# Patient Record
Sex: Male | Born: 1985 | Hispanic: Yes | Marital: Single | Smoking: Former smoker
Health system: Southern US, Community
[De-identification: ages and names within clinical notes are randomized; demographics above are authoritative.]

## PROBLEM LIST (undated history)

## (undated) DIAGNOSIS — I10 Essential (primary) hypertension: Secondary | ICD-10-CM

## (undated) HISTORY — DX: Essential (primary) hypertension: I10

## (undated) HISTORY — PX: HAIR TRANSPLANT: SHX1719

---
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
## 2006-01-26 ENCOUNTER — Emergency Department: Payer: Self-pay | Admitting: Internal Medicine

## 2011-12-29 ENCOUNTER — Emergency Department: Payer: Self-pay | Admitting: Emergency Medicine

## 2017-04-28 ENCOUNTER — Emergency Department
Admission: EM | Admit: 2017-04-28 | Discharge: 2017-04-28 | Disposition: A | Discharge status: Home / Self Care | Payer: Self-pay | Attending: Emergency Medicine | Admitting: Emergency Medicine

## 2017-04-28 ENCOUNTER — Other Ambulatory Visit: Payer: Self-pay

## 2017-04-28 ENCOUNTER — Emergency Department: Payer: Self-pay

## 2017-04-28 DIAGNOSIS — R079 Chest pain, unspecified: Secondary | ICD-10-CM | POA: Insufficient documentation

## 2017-04-28 LAB — TROPONIN I
Troponin I: <0.03 ng/mL (ref <0.03)
Troponin I: <0.03 ng/mL (ref <0.03)

## 2017-04-28 LAB — CBC
HCT: 49.8 % (ref 40.0–52.0)
Hemoglobin: 17.1 g/dL (ref 13.0–18.0)
MCH: 30.9 pg (ref 26.0–34.0)
MCHC: 34.4 g/dL (ref 32.0–36.0)
MCV: 89.8 fL (ref 80.0–100.0)
PLATELETS: 189 10*3/uL (ref 150–440)
RBC: 5.54 MIL/uL (ref 4.40–5.90)
RDW: 12.5 % (ref 11.5–14.5)
WBC: 8.7 10*3/uL (ref 3.8–10.6)

## 2017-04-28 LAB — BASIC METABOLIC PANEL
ANION GAP: 8 (ref 5–15)
BUN: 19 mg/dL (ref 6–20)
CHLORIDE: 108 mmol/L (ref 101–111)
CO2: 23 mmol/L (ref 22–32)
CREATININE: 0.84 mg/dL (ref 0.61–1.24)
Calcium: 9.1 mg/dL (ref 8.9–10.3)
GFR calc non Af Amer: >60 mL/min (ref >60)
Glucose, Bld: 101 mg/dL — ABNORMAL HIGH (ref 65–99)
POTASSIUM: 4 mmol/L (ref 3.5–5.1)
SODIUM: 139 mmol/L (ref 135–145)

## 2017-04-28 LAB — FIBRIN DERIVATIVES D-DIMER (ARMC ONLY): Fibrin derivatives D-dimer (ARMC): 102.52 ng/mL (FEU) (ref 0.00–499.00)

## 2017-04-28 MED ORDER — ASPIRIN 81 MG PO CHEW
324.0000 mg | CHEWABLE_TABLET | Freq: Once | ORAL | Status: AC
  Administered 2017-04-28: 324 mg via ORAL

## 2017-04-28 MED ORDER — ASPIRIN 81 MG PO CHEW
CHEWABLE_TABLET | ORAL | Status: AC
  Filled 2017-04-28: qty 4

## 2017-04-28 MED ORDER — GI COCKTAIL ~~LOC~~
30.0000 mL | Freq: Once | ORAL | Status: AC
  Administered 2017-04-28: 30 mL via ORAL
  Filled 2017-04-28: qty 30

## 2017-04-28 NOTE — ED Triage Notes (Signed)
Patient reports left sided chest pain off/on for several days.  Tonight woke him from sleep.

## 2017-04-28 NOTE — ED Provider Notes (Signed)
[facility] Emergency Department Provider Note _________   First MD Initiated Contact with Patient 04/28/17 0423     (approximate)  I have reviewed the triage vital signs and the nursing notes.   HISTORY  Chief Complaint Chest Pain    HPI Johnathan Burnett is a 32 y.o. male presents to the emergency department with history of intermittent nonradiating sharp left chest pain times "several days.  Patient states that he awoke tonight from sleep with left-sided chest pain as well.  Patient states that the pain is worse with deep inspiration.  Patient denies any cough no fever.  Patient denies any palpitations.  Patient denies any dizziness.  Patient denies any lower extremity pain or swelling.  Patient denies any family or personal history of DVT PE or CAD.  Patient states current pain score 7 out of 10.   Past medical history None There are no active problems to display for this patient.    Prior to Admission medications   Not on File    Allergies No known drug allergies   Family history No family history of CAD or DVT/PE  Social History Social History   Tobacco Use  . Smoking status: Not on file  Substance Use Topics  . Alcohol use: Not on file  . Drug use: Not on file    Review of Systems Constitutional: No fever/chills Eyes: No visual changes. ENT: No sore throat. Cardiovascular: Denies chest pain. Respiratory: Denies shortness of breath. Gastrointestinal: No abdominal pain.  No nausea, no vomiting.  No diarrhea.  No constipation. Genitourinary: Negative for dysuria. Musculoskeletal: Negative for neck pain.  Negative for back pain. Integumentary: Negative for rash. Neurological: Negative for headaches, focal weakness or numbness.  ____________________________________________   PHYSICAL EXAM:  VITAL SIGNS: ED Triage Vitals  Enc Vitals Group     BP 04/28/17 0123 (!) 137/98     Pulse Rate 04/28/17 0123 72     Resp 04/28/17  0123 (!) 74     Temp 04/28/17 0123 98 F (36.7 C)     Temp Source 04/28/17 0123 Oral     SpO2 04/28/17 0123 98 %     Weight 04/28/17 0124 68 kg (150 lb)     Height 04/28/17 0124 1.651 m (5\' 5" )     Head Circumference --      Peak Flow --      Pain Score 04/28/17 0123 10     Pain Loc --      Pain Edu? --      Excl. in GC? --     Constitutional: Alert and oriented. Well appearing and in no acute distress. Eyes: Conjunctivae are normal. Head: Atraumatic. Mouth/Throat: Mucous membranes are moist. Oropharynx non-erythematous. Neck: No stridor.   Cardiovascular: Normal rate, regular rhythm. Good peripheral circulation. Grossly normal heart sounds. Respiratory: Normal respiratory effort.  No retractions. Lungs CTAB. Gastrointestinal: Soft and nontender. No distention.  Musculoskeletal: No lower extremity tenderness nor edema. No gross deformities of extremities. Neurologic:  Normal speech and language. No gross focal neurologic deficits are appreciated.  Skin:  Skin is warm, dry and intact. No rash noted.  ____________________________________________   LABS (all labs ordered are listed, but only abnormal results are displayed)  Labs Reviewed  BASIC METABOLIC PANEL - Abnormal; Notable for the following components:      Result Value   Glucose, Bld 101 (*)    All other components within normal limits  CBC  TROPONIN I  TROPONIN I  FIBRIN DERIVATIVES D-DIMER ([facility] ONLY)   ____________________________________________  EKG  ED ECG REPORT I, [place] N Laquenta Whitsell, the attending physician, personally viewed and interpreted this ECG.   Date: 04/28/2017  EKG Time: 1:20 AM  Rate: 60  Rhythm: Normal sinus rhythm  Axis: Normal  Intervals: Normal  ST&T Change: None  ____________________________________________  RADIOLOGY I, [place] N Rosita Guzzetta, personally viewed and evaluated these images (plain radiographs) as part of my medical decision making, as well as reviewing the written report  by the radiologist.  ED MD interpretation: No acute cardiopulmonary disease  Official radiology report(s): Dg Chest 2 View  Result Date: 04/28/2017 CLINICAL DATA:  32 year old male with left chest pain intermittently for several days. Pain woke him from sleep tonight. EXAM: CHEST - 2 VIEW COMPARISON:  None. FINDINGS: Normal lung volumes. Normal cardiac size and mediastinal contours. Visualized tracheal air column is within normal limits. The lungs are clear. No pneumothorax or pleural effusion. No osseous abnormality identified. Negative visible bowel gas pattern. IMPRESSION: Negative.  No acute cardiopulmonary abnormality. Electronically Signed   By: Odessa Fleming M.D.   On: 04/28/2017 02:08      Procedures   ____________________________________________   INITIAL IMPRESSION / ASSESSMENT AND PLAN / ED COURSE  As part of my medical decision making, I reviewed the following data within the electronic MEDICAL RECORD NUMBER   32 year old male presenting with above-stated history of chest pain very consider the possibility of CAD EKG revealed no evidence of ischemia or infarction.  Troponin negative x2.  Also consider the possibility of pulmonary emboli however d-dimer is negative. ____________________________________________  FINAL CLINICAL IMPRESSION(S) / ED DIAGNOSES  Final diagnoses:  Chest pain, unspecified type     MEDICATIONS GIVEN DURING THIS VISIT:  Medications  gi cocktail (Maalox,Lidocaine,Donnatal) (has no administration in time range)  aspirin chewable tablet 324 mg (324 mg Oral Given 04/28/17 0438)     ED Discharge Orders    None       Note:  This document was prepared using Dragon voice recognition software and may include unintentional dictation errors.    Darci Current, MD 04/28/17 [phone]

## 2017-04-30 ENCOUNTER — Encounter: Payer: Self-pay | Admitting: *Deleted

## 2017-04-30 ENCOUNTER — Ambulatory Visit
Admission: RE | Admit: 2017-04-30 | Discharge: 2017-04-30 | Disposition: A | Discharge status: Home / Self Care | Payer: 59 | Source: Ambulatory Visit | Attending: Internal Medicine | Admitting: Internal Medicine

## 2017-04-30 ENCOUNTER — Encounter
Admission: RE | Disposition: A | Discharge status: Home / Self Care | Payer: Self-pay | Source: Ambulatory Visit | Attending: Internal Medicine

## 2017-04-30 DIAGNOSIS — I200 Unstable angina: Secondary | ICD-10-CM | POA: Insufficient documentation

## 2017-04-30 DIAGNOSIS — I10 Essential (primary) hypertension: Secondary | ICD-10-CM | POA: Diagnosis not present

## 2017-04-30 DIAGNOSIS — R079 Chest pain, unspecified: Secondary | ICD-10-CM

## 2017-04-30 HISTORY — PX: LEFT HEART CATH AND CORONARY ANGIOGRAPHY: CATH118249

## 2017-04-30 LAB — CARDIAC CATHETERIZATION: Cath EF Quantitative: 60 %

## 2017-04-30 SURGERY — LEFT HEART CATH AND CORONARY ANGIOGRAPHY
Anesthesia: Moderate Sedation

## 2017-04-30 MED ORDER — VERAPAMIL HCL 2.5 MG/ML IV SOLN
INTRAVENOUS | Status: AC
  Filled 2017-04-30: qty 2

## 2017-04-30 MED ORDER — SODIUM CHLORIDE 0.9 % WEIGHT BASED INFUSION: 1.0000 mL/kg/h | INTRAVENOUS | Status: DC

## 2017-04-30 MED ORDER — METHYLPREDNISOLONE SODIUM SUCC 125 MG IJ SOLR
INTRAMUSCULAR | Status: AC
  Filled 2017-04-30: qty 2

## 2017-04-30 MED ORDER — ASPIRIN 81 MG PO CHEW: 81.0000 mg | CHEWABLE_TABLET | ORAL | Status: DC

## 2017-04-30 MED ORDER — SODIUM CHLORIDE 0.9% FLUSH: 3.0000 mL | Freq: Two times a day (BID) | INTRAVENOUS | Status: DC

## 2017-04-30 MED ORDER — SODIUM CHLORIDE 0.9 % WEIGHT BASED INFUSION
3.0000 mL/kg/h | INTRAVENOUS | Status: AC
  Administered 2017-04-30: 3 mL/kg/h via INTRAVENOUS

## 2017-04-30 MED ORDER — IOPAMIDOL (ISOVUE-300) INJECTION 61%
INTRAVENOUS | Status: DC | PRN
  Administered 2017-04-30: 70 mL via INTRA_ARTERIAL

## 2017-04-30 MED ORDER — DIPHENHYDRAMINE HCL 50 MG/ML IJ SOLN
INTRAMUSCULAR | Status: AC
  Filled 2017-04-30: qty 1

## 2017-04-30 MED ORDER — VERAPAMIL HCL 2.5 MG/ML IV SOLN
INTRAVENOUS | Status: DC | PRN
  Administered 2017-04-30: 2.5 mg via INTRA_ARTERIAL

## 2017-04-30 MED ORDER — ACETAMINOPHEN 325 MG PO TABS: 650.0000 mg | ORAL_TABLET | ORAL | Status: DC | PRN

## 2017-04-30 MED ORDER — METHYLPREDNISOLONE SODIUM SUCC 125 MG IJ SOLR
INTRAMUSCULAR | Status: DC | PRN
  Administered 2017-04-30: 125 mg via INTRAVENOUS

## 2017-04-30 MED ORDER — SODIUM CHLORIDE 0.9 % IV SOLN: INTRAVENOUS | Status: DC

## 2017-04-30 MED ORDER — DIPHENHYDRAMINE HCL 50 MG/ML IJ SOLN
INTRAMUSCULAR | Status: DC | PRN
  Administered 2017-04-30: 50 mg via INTRAVENOUS

## 2017-04-30 MED ORDER — SODIUM CHLORIDE 0.9 % IV SOLN: 250.0000 mL | INTRAVENOUS | Status: DC | PRN

## 2017-04-30 MED ORDER — MIDAZOLAM HCL 2 MG/2ML IJ SOLN
INTRAMUSCULAR | Status: DC | PRN
  Administered 2017-04-30: 1 mg via INTRAVENOUS

## 2017-04-30 MED ORDER — SODIUM CHLORIDE 0.9% FLUSH: 3.0000 mL | INTRAVENOUS | Status: DC | PRN

## 2017-04-30 MED ORDER — HEPARIN (PORCINE) IN NACL 2-0.9 UNIT/ML-% IJ SOLN
INTRAMUSCULAR | Status: AC
  Filled 2017-04-30: qty 1000

## 2017-04-30 MED ORDER — FENTANYL CITRATE (PF) 100 MCG/2ML IJ SOLN
INTRAMUSCULAR | Status: AC
  Filled 2017-04-30: qty 2

## 2017-04-30 MED ORDER — FAMOTIDINE 20 MG PO TABS
ORAL_TABLET | ORAL | Status: AC
  Administered 2017-04-30: 20 mg
  Filled 2017-04-30: qty 1

## 2017-04-30 MED ORDER — HEPARIN SODIUM (PORCINE) 1000 UNIT/ML IJ SOLN
INTRAMUSCULAR | Status: AC
  Filled 2017-04-30: qty 1

## 2017-04-30 MED ORDER — FENTANYL CITRATE (PF) 100 MCG/2ML IJ SOLN
INTRAMUSCULAR | Status: DC | PRN
  Administered 2017-04-30: 25 ug via INTRAVENOUS

## 2017-04-30 MED ORDER — MIDAZOLAM HCL 2 MG/2ML IJ SOLN
INTRAMUSCULAR | Status: AC
  Filled 2017-04-30: qty 2

## 2017-04-30 MED ORDER — ONDANSETRON HCL 4 MG/2ML IJ SOLN: 4.0000 mg | Freq: Four times a day (QID) | INTRAMUSCULAR | Status: DC | PRN

## 2017-04-30 SURGICAL SUPPLY — 7 items
CATH 5F 110X4 TIG (CATHETERS) ×3 IMPLANT
DEVICE RAD TR BAND REGULAR (VASCULAR PRODUCTS) ×3 IMPLANT
GLIDESHEATH SLEND A-KIT 6F 22G (SHEATH) ×3 IMPLANT
GLIDESHEATH SLEND SS 6F .021 (SHEATH) ×3 IMPLANT
KIT MANI 3VAL PERCEP (MISCELLANEOUS) ×3 IMPLANT
PACK CARDIAC CATH (CUSTOM PROCEDURE TRAY) ×3 IMPLANT
WIRE ROSEN-J .035X260CM (WIRE) ×3 IMPLANT

## 2017-05-01 ENCOUNTER — Encounter: Payer: Self-pay | Admitting: Internal Medicine

## 2017-05-05 ENCOUNTER — Encounter: Payer: Self-pay | Admitting: Internal Medicine

## 2017-09-04 LAB — HM HIV SCREENING LAB: HM HIV Screening: NEGATIVE

## 2019-01-04 ENCOUNTER — Telehealth: Payer: Self-pay | Admitting: General Practice

## 2019-01-04 NOTE — Telephone Encounter (Signed)
Patient called today in regard to making a new patient appt to get a physical done He stated that his dad is a patient of yours and recommended he see you.  Are you okay seeing this patient?

## 2019-01-04 NOTE — Telephone Encounter (Signed)
Ok to schedule in open 30 min slot.  Thank you.  

## 2019-01-05 ENCOUNTER — Other Ambulatory Visit: Payer: Self-pay

## 2019-01-05 ENCOUNTER — Encounter: Payer: Self-pay | Admitting: Family Medicine

## 2019-01-05 ENCOUNTER — Ambulatory Visit: Payer: Self-pay | Admitting: Family Medicine

## 2019-01-05 ENCOUNTER — Other Ambulatory Visit: Payer: Self-pay | Admitting: Family Medicine

## 2019-01-05 DIAGNOSIS — Z202 Contact with and (suspected) exposure to infections with a predominantly sexual mode of transmission: Secondary | ICD-10-CM

## 2019-01-05 DIAGNOSIS — Z113 Encounter for screening for infections with a predominantly sexual mode of transmission: Secondary | ICD-10-CM

## 2019-01-05 MED ORDER — AZITHROMYCIN 500 MG PO TABS
1000.0000 mg | ORAL_TABLET | Freq: Once | ORAL | Status: AC
  Administered 2019-01-05: 1000 mg via ORAL

## 2019-01-05 NOTE — Progress Notes (Signed)
Pt received treatment as a contact to Chlamydia per provider order and per standing order. Counseled pt per provider orders and pt states understanding. Provider orders completed.Ronny Bacon, RN

## 2019-01-05 NOTE — Progress Notes (Signed)
STI clinic/screening visit  Subjective:  Johnathan Burnett is a 33 y.o. male being seen today for an STI screening visit. The patient reports they do not have symptoms.  Patient reports that they do not desire a pregnancy in the next year.   They reported they are not interested in discussing contraception today.   Patient has the following medical conditions:  There are no active problems to display for this patient.   Chief Complaint  Patient presents with  . SEXUALLY TRANSMITTED DISEASE    HPI  Patient reports here for chlamydia treatment today, partner tested positive. Denies GU symptoms, though endorses itching on tops of shoulders x20mo and patch of skin change on abdomen x several years, worse in wintertime.  See flowsheet for further details and programmatic requirements.    The following portions of the patient's history were reviewed and updated as appropriate: allergies, current medications, past medical history, past social history, past surgical history and problem list.  Objective:  There were no vitals filed for this visit.  Physical Exam Constitutional:      Appearance: Normal appearance.  HENT:     Head: Normocephalic and atraumatic.     Comments: No nits or hair loss    Mouth/Throat:     Mouth: Mucous membranes are moist.     Pharynx: Oropharynx is clear. No oropharyngeal exudate or posterior oropharyngeal erythema.  Pulmonary:     Effort: Pulmonary effort is normal.  Abdominal:     General: Abdomen is flat.     Palpations: Abdomen is soft. There is no hepatomegaly or mass.     Tenderness: There is no abdominal tenderness.  Genitourinary:    Pubic Area: No rash or pubic lice.      Penis: Normal and circumcised.      Scrotum/Testes: Normal.     Epididymis:     Right: Normal.     Left: Normal.     Rectum: Normal.  Lymphadenopathy:     Head:     Right side of head: No preauricular or posterior auricular adenopathy.     Left side of head: No  preauricular or posterior auricular adenopathy.     Cervical: No cervical adenopathy.     Upper Body:     Right upper body: No supraclavicular or axillary adenopathy.     Left upper body: No supraclavicular or axillary adenopathy.     Lower Body: No right inguinal adenopathy. No left inguinal adenopathy.  Skin:    General: Skin is warm and dry.     Findings: No rash (no rash aside from patch of thickened skin on abdomen).       Neurological:     Mental Status: He is alert and oriented to person, place, and time.       Assessment and Plan:  Johnathan Burnett is a 33 y.o. male presenting to the [facility] for STI screening  1. Screening examination for venereal disease -Screenings today as below.  -Patient does not meet criteria for HepB HepC Screening.  -Discussed that GC/CT NAAT may take >1 week to result. Counseled on warning s/sx and when to seek care. Recommended condom use with all sex. 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 Handout of primary care providers also given to pt upon request.  - Gonococcus culture - throat - Gonococcus culture - urethra - HIV [facility] - Syphilis Serology, [facility]  2. Chlamydia contact -Treatment today as below. Pt to RTC if vomits < 2 hr after taking medicine. -Advised no sex for 7 days after both pt and partner completes treatment and encouraged condoms with all sex. - azithromycin (ZITHROMAX) tablet 1,000 mg    Return for screening as needed.  Future Appointments  Date Time Provider [place]  01/06/2019  3:00 PM Doreen Beam, FNP [facility]  01/08/2019  3:00 PM Ria Bush, MD [facility]    Kandee Keen, PA-C

## 2019-01-06 ENCOUNTER — Ambulatory Visit: Payer: Self-pay | Admitting: Adult Health

## 2019-01-08 ENCOUNTER — Ambulatory Visit: Payer: Self-pay | Admitting: Family Medicine

## 2019-01-12 LAB — GONOCOCCUS CULTURE

## 2019-02-03 ENCOUNTER — Other Ambulatory Visit: Payer: Self-pay

## 2019-02-03 DIAGNOSIS — Z20822 Contact with and (suspected) exposure to covid-19: Secondary | ICD-10-CM

## 2019-02-04 LAB — NOVEL CORONAVIRUS, NAA: SARS-CoV-2, NAA: DETECTED — AB

## 2019-02-08 ENCOUNTER — Telehealth: Payer: Self-pay | Admitting: Internal Medicine

## 2019-02-08 NOTE — Telephone Encounter (Signed)
Patient called about Positive Covid test from testing event. The patient has mild upper respiratory symptoms and occasional fever. Symptoms are mild at this time. Patient understands the needs to stay in isolation for a total of 10 days from onset of symptom or 14 days total from date of testing if no symptom. Patient knows the health department may be in touch. I answered all of patient's questions and all concerns addressed.  Jeanann Lewandowsky, MD

## 2019-07-02 IMAGING — CR DG CHEST 2V
2 series · 2 of 2 positions shown · non-contrast
Comparison: None.

CLINICAL DATA: 32-year-old male with left chest pain intermittently
for several days. Pain woke him from sleep tonight.

EXAM:
CHEST - 2 VIEW

[chest pa]
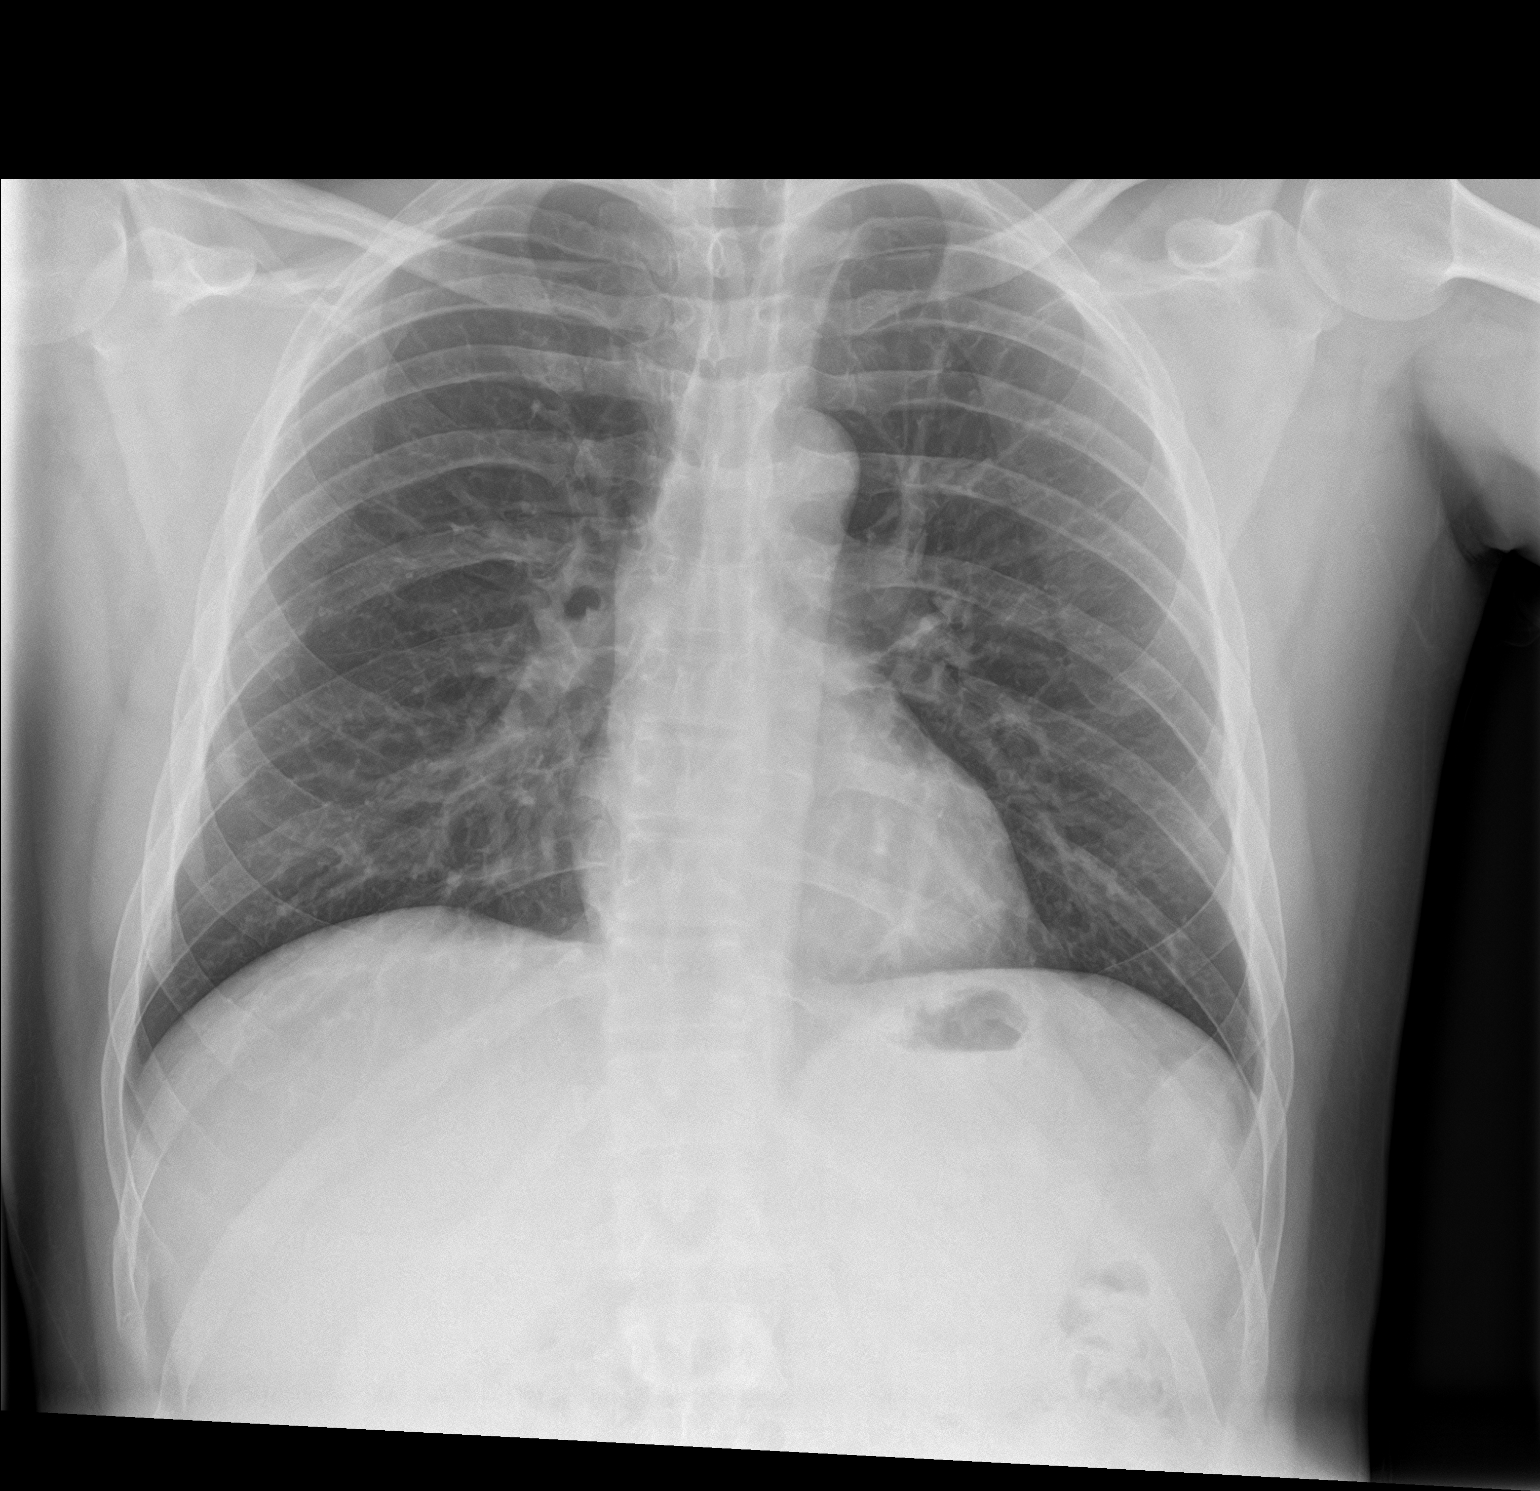

[chest lat]
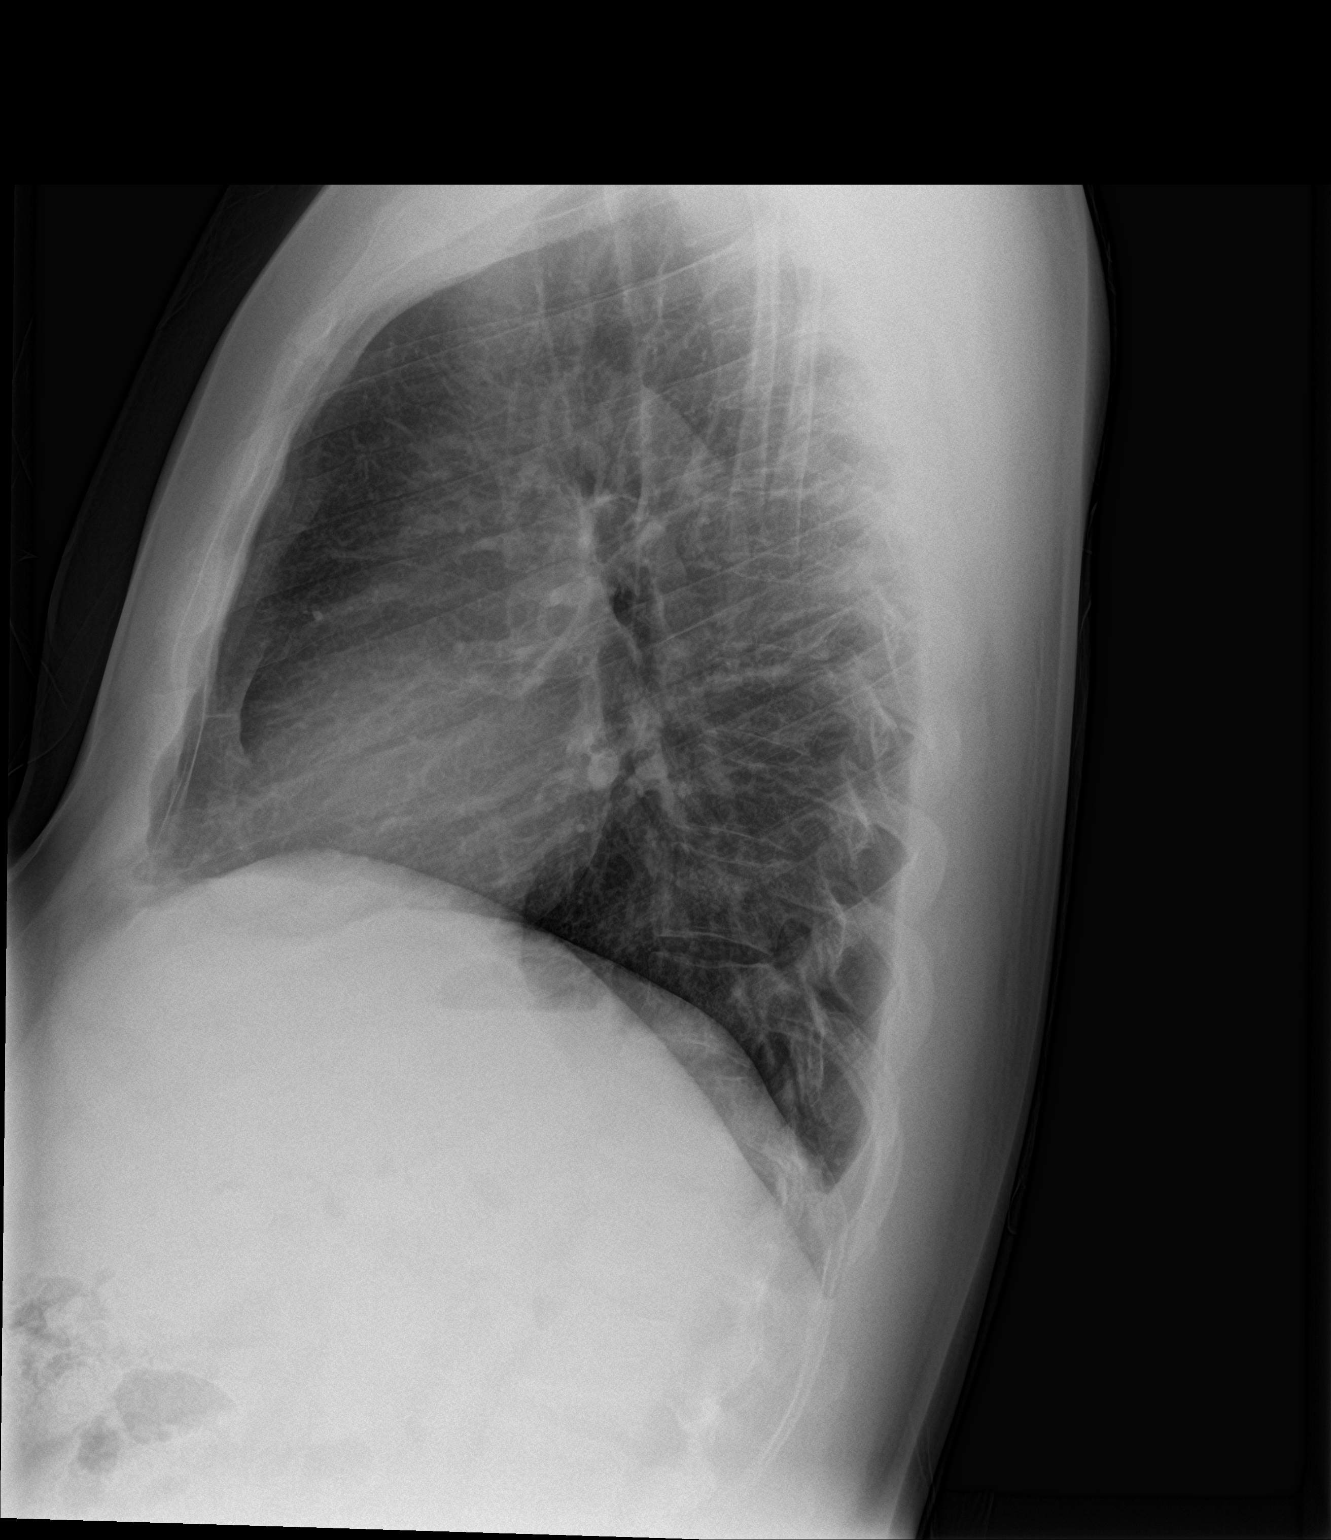

[2 of 2 positions shown; findings below may reference images not displayed]

FINDINGS: Normal lung volumes. Normal cardiac size and mediastinal contours.
Visualized tracheal air column is within normal limits. The lungs
are clear. No pneumothorax or pleural effusion. No osseous
abnormality identified. Negative visible bowel gas pattern.
IMPRESSION: Negative.  No acute cardiopulmonary abnormality.

## 2020-02-10 ENCOUNTER — Other Ambulatory Visit: Payer: Self-pay

## 2020-02-10 ENCOUNTER — Encounter: Payer: Self-pay | Admitting: Emergency Medicine

## 2020-02-10 ENCOUNTER — Ambulatory Visit
Admission: EM | Admit: 2020-02-10 | Discharge: 2020-02-10 | Disposition: A | Discharge status: Home / Self Care | Payer: Self-pay | Attending: Family Medicine | Admitting: Family Medicine

## 2020-02-10 ENCOUNTER — Ambulatory Visit: Admission: EM | Admit: 2020-02-10 | Discharge: 2020-02-10 | Discharge status: Against Medical Advice | Payer: Self-pay

## 2020-02-10 DIAGNOSIS — R21 Rash and other nonspecific skin eruption: Secondary | ICD-10-CM

## 2020-02-10 MED ORDER — HYDROXYZINE HCL 25 MG PO TABS: 25.0000 mg | ORAL_TABLET | Freq: Three times a day (TID) | ORAL | 0 refills | Status: DC | PRN

## 2020-02-10 MED ORDER — PREDNISONE 10 MG (21) PO TBPK: ORAL_TABLET | ORAL | 0 refills | Status: DC

## 2020-02-10 NOTE — ED Triage Notes (Signed)
Patient c/o itchy rash all over his body that started on Christmas day.

## 2020-02-10 NOTE — Discharge Instructions (Signed)
Medications as prescribed.  Continue OTC Claritin (Loratadine).   If persists, see Allergy. [facility] All[facility] [address] [place], [place]  [address] [phone] (phone)

## 2020-02-10 NOTE — ED Provider Notes (Signed)
[facility]    CSN: [id number] Arrival date & time: 02/10/20  1216      History   Chief Complaint Chief Complaint  Patient presents with  . Rash   HPI  35 year old male presents with rash and itching.  Patient reports that he developed a rash and associated itching on Christmas.  Resolved after taking levo cetirizine.  Recurred again yesterday.  Diffuse itching.  Mild rash.  No relieving factors.  He notes that he recently got a new pair of sheets.  That is the only thing that he is aware of that is new.  No other reported symptoms.  No other complaints.  Home Medications    Prior to Admission medications   Medication Sig Start Date End Date Taking? Authorizing Provider  hydrOXYzine (ATARAX/VISTARIL) 25 MG tablet Take 1 tablet (25 mg total) by mouth every 8 (eight) hours as needed for itching. 02/10/20  Yes Zaire Levesque G, DO  predniSONE (STERAPRED UNI-PAK 21 TAB) 10 MG (21) TBPK tablet 6 tablets on day 1; decrease by 1 tablet daily until gone. 02/10/20  Yes Tommie Sams, DO    Family History Family History  Problem Relation Age of Onset  . Diabetes Paternal Grandmother     Social History Social History   Tobacco Use  . Smoking status: Current Some Day Smoker    Types: Cigarettes  . Smokeless tobacco: Never Used  Vaping Use  . Vaping Use: Never used  Substance Use Topics  . Alcohol use: Yes    Alcohol/week: 8.0 standard drinks    Types: 8 Cans of beer per week  . Drug use: Never     Allergies   Patient has no known allergies.   Review of Systems Review of Systems  Constitutional: Negative.   Skin: Positive for rash.   Physical Exam Triage Vital Signs ED Triage Vitals  Enc Vitals Group     BP 02/10/20 1311 (!) 138/111     Pulse Rate 02/10/20 1311 85     Resp 02/10/20 1311 16     Temp 02/10/20 1311 98.4 F (36.9 C)     Temp Source 02/10/20 1311 Oral     SpO2 02/10/20 1311 97 %     Weight 02/10/20 1308 160 lb (72.6 kg)     Height 02/10/20  1308 5\' 4"  (1.626 m)     Head Circumference --      Peak Flow --      Pain Score 02/10/20 1308 0     Pain Loc --      Pain Edu? --      Excl. in GC? --    Updated Vital Signs BP (!) 138/111 (BP Location: Right Arm)   Pulse 85   Temp 98.4 F (36.9 C) (Oral)   Resp 16   Ht 5\' 4"  (1.626 m)   Wt 72.6 kg   SpO2 97%   BMI 27.46 kg/m   Visual Acuity Right Eye Distance:   Left Eye Distance:   Bilateral Distance:    Right Eye Near:   Left Eye Near:    Bilateral Near:     Physical Exam Vitals and nursing note reviewed.  Constitutional:      General: He is not in acute distress.    Appearance: Normal appearance. He is not ill-appearing.  HENT:     Head: Normocephalic and atraumatic.  Eyes:     General:        Right eye: No discharge.  Left eye: No discharge.     Conjunctiva/sclera: Conjunctivae normal.  Pulmonary:     Effort: Pulmonary effort is normal. No respiratory distress.  Skin:    Comments: Scattered areas of erythema. Dermatographia noted.   Neurological:     Mental Status: He is alert.  Psychiatric:        Mood and Affect: Mood normal.        Behavior: Behavior normal.    UC Treatments / Results  Labs (all labs ordered are listed, but only abnormal results are displayed) Labs Reviewed - No data to display  EKG   Radiology No results found.  Procedures Procedures (including critical care time)  Medications Ordered in UC Medications - No data to display  Initial Impression / Assessment and Plan / UC Course  I have reviewed the triage vital signs and the nursing notes.  Pertinent labs & imaging results that were available during my care of the patient were reviewed by me and considered in my medical decision making (see chart for details).    35 year old male presents with a rash and associated itching.  Patient has dermatographia on exam.  Placing on prednisone and Atarax.  Advised continued use of levocetirizine.  Information given  regarding Allergist if recurs or persists.   Final Clinical Impressions(s) / UC Diagnoses   Final diagnoses:  Rash     Discharge Instructions     Medications as prescribed.  Continue OTC Claritin (Loratadine).   If persists, see Allergy. [facility] [address] [place], [place]  [address] [phone] (phone)   ED Prescriptions    Medication Sig Dispense Auth. Provider   predniSONE (STERAPRED UNI-PAK 21 TAB) 10 MG (21) TBPK tablet 6 tablets on day 1; decrease by 1 tablet daily until gone. 21 tablet Jaylanie Boschee G, DO   hydrOXYzine (ATARAX/VISTARIL) 25 MG tablet Take 1 tablet (25 mg total) by mouth every 8 (eight) hours as needed for itching. 30 tablet Tommie Sams, DO     PDMP not reviewed this encounter.   Tommie Sams, [place] 02/10/20 1434

## 2020-03-06 ENCOUNTER — Encounter: Payer: Self-pay | Admitting: Emergency Medicine

## 2020-03-06 ENCOUNTER — Other Ambulatory Visit: Payer: Self-pay

## 2020-03-06 ENCOUNTER — Ambulatory Visit
Admission: EM | Admit: 2020-03-06 | Discharge: 2020-03-06 | Disposition: A | Discharge status: Home / Self Care | Payer: Self-pay

## 2020-03-06 DIAGNOSIS — R197 Diarrhea, unspecified: Secondary | ICD-10-CM

## 2020-03-06 DIAGNOSIS — R030 Elevated blood-pressure reading, without diagnosis of hypertension: Secondary | ICD-10-CM

## 2020-03-06 NOTE — ED Provider Notes (Signed)
[facility]    CSN: [id number] Arrival date & time: 03/06/20  1126      History   Chief Complaint Chief Complaint  Patient presents with  . Diarrhea    HPI Johnathan Burnett is a 35 y.o. male who presents with fever and body aches 3 days ago x 2 days. Has been having diarrhea x 3 days 5-8 times  A day. Only x 3 today and body aches are gone. Had negative Covid test 3 days ago and was  Denies vomiting. Has been nauseous. Has not been hungry. Denies blood in the stool. Yesterday ate at a restaurant and ate crab and chcickent. Denies being out the country or taking antibiotics in the past month.  He did have a coworker who had diarrhea last week.  He had broth this am before he came, and states he feels much better than the past 2 days, but wants something to stop the diarrhea  History reviewed. No pertinent past medical history.  There are no problems to display for this patient.   Past Surgical History:  Procedure Laterality Date  . LEFT HEART CATH AND CORONARY ANGIOGRAPHY N/A 04/30/2017   Procedure: LEFT HEART CATH AND CORONARY ANGIOGRAPHY;  Surgeon: Yolonda Kida, MD;  Location: [facility];  Service: Cardiovascular;  Laterality: N/A;     Home Medications    Prior to Admission medications   Not on File    Family History Family History  Problem Relation Age of Onset  . Diabetes Paternal Grandmother     Social History Social History   Tobacco Use  . Smoking status: Current Some Day Smoker    Types: Cigarettes  . Smokeless tobacco: Never Used  Vaping Use  . Vaping Use: Never used  Substance Use Topics  . Alcohol use: Yes    Alcohol/week: 8.0 standard drinks    Types: 8 Cans of beer per week  . Drug use: Never     Allergies   Patient has no known allergies.   Review of Systems Review of Systems  Constitutional: Positive for appetite change and diaphoresis. Negative for activity change, chills and fatigue.       +  subjective fever  HENT: Negative for congestion, postnasal drip, rhinorrhea and sore throat.   Eyes: Negative for discharge.  Respiratory: Negative for cough, chest tightness and shortness of breath.   Cardiovascular: Negative for chest pain.  Gastrointestinal: Positive for diarrhea. Negative for abdominal pain, constipation, nausea and vomiting.  Musculoskeletal: Negative for gait problem and myalgias.  Skin: Negative for rash.  Neurological: Negative for headaches.     Physical Exam Triage Vital Signs ED Triage Vitals  Enc Vitals Group     BP 03/06/20 1233 (!) 131/100     Pulse Rate 03/06/20 1233 78     Resp 03/06/20 1233 18     Temp 03/06/20 1233 98 F (36.7 C)     Temp Source 03/06/20 1233 Oral     SpO2 03/06/20 1233 99 %     Weight 03/06/20 1232 160 lb 0.9 oz (72.6 kg)     Height 03/06/20 1232 5' 4"  (1.626 m)     Head Circumference --      Peak Flow --      Pain Score 03/06/20 1231 0     Pain Loc --      Pain Edu? --      Excl. in [place]? --    No data found.  Updated Vital Signs  BP (!) 131/100 (BP Location: Right Arm)   Pulse 78   Temp 98 F (36.7 C) (Oral)   Resp 18   Ht 5' 4"  (1.626 m)   Wt 160 lb 0.9 oz (72.6 kg)   SpO2 99%   BMI 27.47 kg/m   Visual Acuity Right Eye Distance:   Left Eye Distance:   Bilateral Distance:    Right Eye Near:   Left Eye Near:    Bilateral Near:     Physical Exam Physical Exam Vitals signs and nursing note reviewed.  Constitutional:      General: he is not in acute distress.    Appearance: Normal appearance. he is not ill-appearing, toxic-appearing or diaphoretic.  HENT:     Head: Normocephalic.     Right Ear: Tympanic membrane, ear canal and external ear normal.     Left Ear: Tympanic membrane, ear canal and external ear normal.     Nose: Nose normal.     Mouth/Throat:     Mouth: Mucous membranes are moist.  Eyes:     General: No scleral icterus.       Right eye: No discharge.        Left eye: No discharge.      Conjunctiva/sclera: Conjunctivae normal.  Neck:     Musculoskeletal: Neck supple. No neck rigidity.  Cardiovascular:     Rate and Rhythm: Normal rate and regular rhythm.     Heart sounds: No murmur.  Pulmonary:     Effort: Pulmonary effort is normal.     Breath sounds: Normal breath sounds.  Abdominal:     General: Bowel sounds are normal. There is no distension.     Palpations: Abdomen is soft. There is no mass.     Tenderness: There is no abdominal tenderness. There is no guarding or rebound.     Hernia: No hernia is present.  Musculoskeletal: Normal range of motion.  Lymphadenopathy:     Cervical: No cervical adenopathy.  Skin:    General: Skin is warm and dry.     Coloration: Skin is not jaundiced.     Findings: No rash.  Neurological:     Mental Status: he is alert and oriented to person, place, and time.     Gait: Gait normal.  Psychiatric:        Mood and Affect: Mood normal.        Behavior: Behavior normal.        Thought Content: Thought content normal.        Judgment: Judgment normal.     UC Treatments / Results  Labs (all labs ordered are listed, but only abnormal results are displayed) Labs Reviewed - No data to display  EKG   Radiology No results found.  Procedures Procedures (including critical care time)  Medications Ordered in UC Medications - No data to display  Initial Impression / Assessment and Plan / UC Course  I have reviewed the triage vital signs and the nursing notes. Diarrhea which may be viral vs Covid. Since he does not have insurance he prefers doing a covid test at home. Will follow BRAT diet and advised to take Pepto which will allow the virus to continue sheding, where the imodium or other antidiarrhea Rx will retain the virus and prolong the illness.  He was also advised to do BP readings and if => 140/90 x 3 when he is well, needs to get established with a PCP  Final Clinical Impressions(s) / UC Diagnoses  Final diagnoses:   Diarrhea, unspecified type  Elevated blood pressure reading     Discharge Instructions     You may tape Pepto  and take it as indicated per the box If you get worse or see blood in the stool, you need to be seen.  You may retest yourself for Covid with the kit you have at home.   Do blood pressure checks and if your number is 140/90 or more for 3 different times you need blood pressure medication and make sure you get established with a family Dr.     ED Prescriptions    None     PDMP not reviewed this encounter.   Shelby Mattocks, PA-C 03/06/20 1334

## 2020-03-06 NOTE — Discharge Instructions (Addendum)
You may tape Pepto  and take it as indicated per the box If you get worse or see blood in the stool, you need to be seen.  You may retest yourself for Covid with the kit you have at home.   Do blood pressure checks and if your number is 140/90 or more for 3 different times you need blood pressure medication and make sure you get established with a family Dr.

## 2020-03-06 NOTE — ED Triage Notes (Signed)
Patient c/o fever, diarrhea and body aches that started Friday. He states the diarrhea has continued and he thinks he had a fever this morning but did not check this. He did test negative for COVID on Friday.

## 2022-02-22 NOTE — Progress Notes (Signed)
Sleep Medicine   Office Visit  Patient Name: Johnathan Burnett DOB: 1966/05/31 MRN [id number]    Chief Complaint: sleep evaluation  Brief History:  Niklaus presents for an initial consult for sleep evaluation and to establish care. Patient has a long history of snoring. This is the same in all positions.   Sleep quality is sometimes good and sometimes poor. This is noted most nights. The patient's bed partner reports  loud snoring and witnessed apnea at night. The patient relates the following symptoms: Loud snoring and gasping are also present. The patient goes to sleep at 11pm and wakes up at 7am.  Sleep quality is same when outside home environment.  Patient has noted restlessness of his legs at night that would disrupt his sleep.  The patient  relates no  unusual behavior during the night.  The patient relates no history of psychiatric problems. The Epworth Sleepiness Score is 3 out of 24 .  The patient relates  Cardiovascular risk factors include: elevated blood pressure.     ROS  General: (-) fever, (-) chills, (-) night sweat Nose and Sinuses: (-) nasal stuffiness or itchiness, (-) postnasal drip, (-) nosebleeds, (-) sinus trouble. Mouth and Throat: (-) sore throat, (-) hoarseness. Neck: (-) swollen glands, (-) enlarged thyroid, (-) neck pain. Respiratory: - cough, - shortness of breath, - wheezing. Neurologic: - numbness, - tingling. Psychiatric: - anxiety, - depression Sleep behavior: -sleep paralysis -hypnogogic hallucinations -dream enactment      -vivid dreams -cataplexy -night terrors -sleep walking   Current Medication: No outpatient encounter medications on file as of 02/25/2022.   No facility-administered encounter medications on file as of 02/25/2022.    Surgical History: Past Surgical History:  Procedure Laterality Date   LEFT HEART CATH AND CORONARY ANGIOGRAPHY N/A 04/30/2017   Procedure: LEFT HEART CATH AND CORONARY ANGIOGRAPHY;  Surgeon: Yolonda Kida,  MD;  Location: [facility];  Service: Cardiovascular;  Laterality: N/A;    Medical History: History reviewed. No pertinent past medical history.  Family History: Non contributory to the present illness  Social History: Social History   Socioeconomic History   Marital status: Single    Spouse name: Not on file   Number of children: Not on file   Years of education: Not on file   Highest education level: Not on file  Occupational History   Not on file  Tobacco Use   Smoking status: Some Days    Types: Cigarettes   Smokeless tobacco: Never  Vaping Use   Vaping Use: Never used  Substance and Sexual Activity   Alcohol use: Yes    Alcohol/week: 8.0 standard drinks of alcohol    Types: 8 Cans of beer per week   Drug use: Never   Sexual activity: Yes    Partners: Female    Birth control/protection: None  Other Topics Concern   Not on file  Social History Narrative   Not on file   Social Determinants of Health   Financial Resource Strain: Not on file  Food Insecurity: Not on file  Transportation Needs: Not on file  Physical Activity: Not on file  Stress: Not on file  Social Connections: Not on file  Intimate Partner Violence: Not on file    Vital Signs: Blood pressure (!) 144/101, pulse 81, resp. rate 14, height 5\' 3"  (1.6 m), weight 172 lb (78 kg), SpO2 98 %. Body mass index is 30.47 kg/m.   Examination: General Appearance: The patient is well-developed, well-nourished, and  in no distress. Neck Circumference: 40 cm Skin: Gross inspection of skin unremarkable. Head: normocephalic, no gross deformities. Eyes: no gross deformities noted. ENT: ears appear grossly normal Neurologic: Alert and oriented. No involuntary movements.    STOP BANG RISK ASSESSMENT S (snore) Have you been told that you snore?     YES   T (tired) Are you often tired, fatigued, or sleepy during the day?   NO  O (obstruction) Do you stop breathing, choke, or gasp during sleep?  YES   P (pressure) Do you have or are you being treated for high blood pressure? NO   B (BMI) Is your body index greater than 35 kg/m? NO   A (age) Are you 36 years old or older? NO   N (neck) Do you have a neck circumference greater than 16 inches?   YES   G (gender) Are you a male? YES   TOTAL STOP/BANG "YES" ANSWERS 4                                                               A STOP-Bang score of 2 or less is considered low risk, and a score of 5 or more is high risk for having either moderate or severe OSA. For people who score 3 or 4, doctors may need to perform further assessment to determine how likely they are to have OSA.         EPWORTH SLEEPINESS SCALE:  Scale:  (0)= no chance of dozing; (1)= slight chance of dozing; (2)= moderate chance of dozing; (3)= high chance of dozing  Chance  Situtation    Sitting and reading: 0    Watching TV: 1    Sitting Inactive in public: 0    As a passenger in car: 1      Lying down to rest: 0    Sitting and talking: 0    Sitting quielty after lunch: 1    In a car, stopped in traffic: 0   TOTAL SCORE:   3 out of 24    SLEEP STUDIES:  None   LABS: No results found for this or any previous visit (from the past 2160 hour(s)).  Radiology: No results found.  No results found.  No results found.    Assessment and Plan: Patient Active Problem List   Diagnosis Date Noted   OSA (obstructive sleep apnea) 02/25/2022   Snoring 02/25/2022   Obesity (BMI 30-39.9) 02/25/2022   Elevated blood pressure reading without diagnosis of hypertension 02/25/2022   Allergic rhinitis due to animal (cat) (dog) hair and dander 02/25/2022   Allergic rhinitis 02/25/2022   1. OSA (obstructive sleep apnea) PLAN OSA:   Patient evaluation suggests high risk of sleep disordered breathing due to witnessed apnea, gasping, snoring.  Patient has comorbid cardiovascular risk factors including: elevated blood pressure which could be  exacerbated by pathologic sleep-disordered breathing.  Suggest: PSG to assess  the patient's sleep disordered breathing. The patient was also counselled on weight loss to optimize sleep health.   2. Snoring Await psg. He reports nasal collapse and difficulty breathing through his left nostril. History of many traumas to the nose. Recommend ENT evaluation.   3. Obesity (BMI 30-39.9) Obesity Counseling: Had a lengthy discussion regarding patients BMI and weight issues.  Patient was instructed on portion control as well as increased activity. Also discussed caloric restrictions with trying to maintain intake less than 2000 Kcal. Discussions were made in accordance with the 5As of weight management. Simple actions such as not eating late and if able to, taking a walk is suggested.   4. Elevated blood pressure reading without diagnosis of hypertension We discussed today's reading at length. He is asymptomatic. He does not have a PCP. He will purchase a blood pressure monitor and start checking and will find a provider to follow up with.  Hypertension Counseling:   The following hypertensive lifestyle modification were recommended and discussed:  1. Limiting alcohol intake to less than 1 oz/day of ethanol:(24 oz of beer or 8 oz of wine or 2 oz of 100-proof whiskey). 2. Take baby ASA 81 mg daily. 3. Importance of regular aerobic exercise and losing weight. 4. Reduce dietary saturated fat and cholesterol intake for overall cardiovascular health. 5. Maintaining adequate dietary potassium, calcium, and magnesium intake. 6. Regular monitoring of the blood pressure. 7. Reduce sodium intake to less than 100 mmol/day (less than 2.3 gm of sodium or less than 6 gm of sodium choride)      General Counseling: I have discussed the findings of the evaluation and examination with Christy Sartorius.  I have also discussed any further diagnostic evaluation thatmay be needed or ordered today. Orel verbalizes understanding of  the findings of todays visit. We also reviewed his medications today and discussed drug interactions and side effects including but not limited excessive drowsiness and altered mental states. We also discussed that there is always a risk not just to him but also people around him. he has been encouraged to call the office with any questions or concerns that should arise related to todays visit.  No orders of the defined types were placed in this encounter.       I have personally obtained a history, evaluated the patient, evaluated pertinent data, formulated the assessment and plan and placed orders.   This patient was seen today by Tressie Ellis, PA-C in collaboration with Dr. Devona Konig.   Allyne Gee, MD [facility] Diplomate ABMS Pulmonary and Critical Care Medicine Sleep medicine

## 2022-02-25 ENCOUNTER — Ambulatory Visit (INDEPENDENT_AMBULATORY_CARE_PROVIDER_SITE_OTHER): Payer: BLUE CROSS/BLUE SHIELD | Admitting: Internal Medicine

## 2022-02-25 VITALS — BP 144/101 | HR 81 | Resp 14 | Ht 63.0 in | Wt 172.0 lb

## 2022-02-25 DIAGNOSIS — R030 Elevated blood-pressure reading, without diagnosis of hypertension: Secondary | ICD-10-CM

## 2022-02-25 DIAGNOSIS — G4733 Obstructive sleep apnea (adult) (pediatric): Secondary | ICD-10-CM

## 2022-02-25 DIAGNOSIS — R0683 Snoring: Secondary | ICD-10-CM | POA: Insufficient documentation

## 2022-02-25 DIAGNOSIS — E669 Obesity, unspecified: Secondary | ICD-10-CM | POA: Diagnosis not present

## 2022-02-25 DIAGNOSIS — J3081 Allergic rhinitis due to animal (cat) (dog) hair and dander: Secondary | ICD-10-CM | POA: Insufficient documentation

## 2022-02-25 DIAGNOSIS — J309 Allergic rhinitis, unspecified: Secondary | ICD-10-CM | POA: Insufficient documentation

## 2022-02-26 ENCOUNTER — Ambulatory Visit
Admission: EM | Admit: 2022-02-26 | Discharge: 2022-02-26 | Disposition: A | Discharge status: Home / Self Care | Payer: BLUE CROSS/BLUE SHIELD

## 2022-02-26 DIAGNOSIS — R030 Elevated blood-pressure reading, without diagnosis of hypertension: Secondary | ICD-10-CM

## 2022-02-26 NOTE — ED Notes (Signed)
Primary Care appointment established for patient with Johnathan Burnett at [facility]. Appointment made for Wednesday March 06, 2022.

## 2022-02-26 NOTE — Discharge Instructions (Addendum)
Your blood pressure is elevated today at 138/100.  Please have this rechecked by your primary care provider in 2-4 weeks.

## 2022-02-26 NOTE — ED Triage Notes (Addendum)
Patient to Urgent Care with complaints of elevated blood pressure readings x1 month.   Reports BP readings yesterday 140/99, this morning readings 122/91. Reports checking it several weeks ago while in [place] and it was also elevated.   Denies any symptoms.   Patient attempted to set up pcp appointment prior to being seen at Urgent Care.

## 2022-02-26 NOTE — ED Provider Notes (Signed)
[facility] Johnathan Burnett    CSN: [id number] Arrival date & time: 02/26/22  0998      History   Chief Complaint Chief Complaint  Patient presents with   Hypertension    HPI Johnathan Burnett is a 37 y.o. male.  Patient presents with concern for elevated blood pressure readings over the past month.  He has checked it at home and it was checked at pulmonary clinic yesterday.  Blood pressure was 144/101 at pulmonology yesterday.  His readings at home have been  122-140/91-99.  He denies weakness, numbness, dizziness, headache, chest pain, shortness of breath, edema, or other symptoms.  He does not have a PCP.  Per patient's chart, elevated blood pressure readings have been noted for the past 4+ years.  His medical history includes obstructive sleep apnea.  The history is provided by the patient and medical records.    History reviewed. No pertinent past medical history.  Patient Active Problem List   Diagnosis Date Noted   OSA (obstructive sleep apnea) 02/25/2022   Snoring 02/25/2022   Obesity (BMI 30-39.9) 02/25/2022   Elevated blood pressure reading without diagnosis of hypertension 02/25/2022   Allergic rhinitis due to animal (cat) (dog) hair and dander 02/25/2022   Allergic rhinitis 02/25/2022    Past Surgical History:  Procedure Laterality Date   LEFT HEART CATH AND CORONARY ANGIOGRAPHY N/A 04/30/2017   Procedure: LEFT HEART CATH AND CORONARY ANGIOGRAPHY;  Surgeon: Yolonda Kida, MD;  Location: [facility];  Service: Cardiovascular;  Laterality: N/A;       Home Medications    Prior to Admission medications   Not on File    Family History Family History  Problem Relation Age of Onset   Diabetes Paternal Grandmother     Social History Social History   Tobacco Use   Smoking status: Some Days    Types: Cigarettes   Smokeless tobacco: Never  Vaping Use   Vaping Use: Never used  Substance Use Topics   Alcohol use: Yes    Alcohol/week: 8.0  standard drinks of alcohol    Types: 8 Cans of beer per week   Drug use: Never     Allergies   Patient has no known allergies.   Review of Systems Review of Systems  Respiratory:  Negative for cough and shortness of breath.   Cardiovascular:  Negative for chest pain, palpitations and leg swelling.  Neurological:  Negative for dizziness, seizures, syncope, facial asymmetry, speech difficulty, weakness, light-headedness, numbness and headaches.  All other systems reviewed and are negative.    Physical Exam Triage Vital Signs ED Triage Vitals  Enc Vitals Group     BP --      Pulse Rate 02/26/22 1005 71     Resp 02/26/22 1005 18     Temp 02/26/22 1005 97.8 F (36.6 C)     Temp src --      SpO2 02/26/22 1005 97 %     Weight --      Height --      Head Circumference --      Peak Flow --      Pain Score 02/26/22 1000 0     Pain Loc --      Pain Edu? --      Excl. in [place]? --    No data found.  Updated Vital Signs BP (!) 138/100   Pulse 71   Temp 97.8 F (36.6 C)   Resp 18   Ht  5\' 3"  (1.6 m)   Wt 172 lb (78 kg)   SpO2 97%   BMI 30.47 kg/m   Visual Acuity Right Eye Distance:   Left Eye Distance:   Bilateral Distance:    Right Eye Near:   Left Eye Near:    Bilateral Near:     Physical Exam Vitals and nursing note reviewed.  Constitutional:      General: He is not in acute distress.    Appearance: Normal appearance. He is well-developed. He is not ill-appearing.  HENT:     Mouth/Throat:     Mouth: Mucous membranes are moist.  Cardiovascular:     Rate and Rhythm: Normal rate and regular rhythm.     Heart sounds: Normal heart sounds.  Pulmonary:     Effort: Pulmonary effort is normal. No respiratory distress.     Breath sounds: Normal breath sounds.  Musculoskeletal:     Cervical back: Neck supple.  Skin:    General: Skin is warm and dry.  Neurological:     General: No focal deficit present.     Mental Status: He is alert and oriented to person,  place, and time.     Sensory: No sensory deficit.     Motor: No weakness.     Gait: Gait normal.  Psychiatric:        Mood and Affect: Mood normal.      UC Treatments / Results  Labs (all labs ordered are listed, but only abnormal results are displayed) Labs Reviewed - No data to display  EKG   Radiology No results found.  Procedures Procedures (including critical care time)  Medications Ordered in UC Medications - No data to display  Initial Impression / Assessment and Plan / UC Course  I have reviewed the triage vital signs and the nursing notes.  Pertinent labs & imaging results that were available during my care of the patient were reviewed by me and considered in my medical decision making (see chart for details).   Elevated blood pressure reading.  Patient's blood pressure readings have been elevated in his chart since 2019.  He does not have a PCP currently.  Our nurse scheduled him with an appointment to establish a PCP on 03/06/2022.  Instructed patient to have blood pressure checked by his new PCP. Education provided today on preventing hypertension.  He agrees to plan of care.    Final Clinical Impressions(s) / UC Diagnoses   Final diagnoses:  Elevated blood pressure reading without diagnosis of hypertension     Discharge Instructions      Your blood pressure is elevated today at 138/100.  Please have this rechecked by your primary care provider in 2-4 weeks.          ED Prescriptions   None    PDMP not reviewed this encounter.   Sharion Balloon, NP 02/26/22 1028

## 2022-03-06 ENCOUNTER — Ambulatory Visit (INDEPENDENT_AMBULATORY_CARE_PROVIDER_SITE_OTHER): Payer: BLUE CROSS/BLUE SHIELD | Admitting: Family Medicine

## 2022-03-06 ENCOUNTER — Encounter: Payer: Self-pay | Admitting: Family Medicine

## 2022-03-06 VITALS — BP 140/90 | HR 78 | Temp 97.6°F | Ht 63.0 in | Wt 174.8 lb

## 2022-03-06 DIAGNOSIS — R0789 Other chest pain: Secondary | ICD-10-CM | POA: Diagnosis not present

## 2022-03-06 DIAGNOSIS — G4733 Obstructive sleep apnea (adult) (pediatric): Secondary | ICD-10-CM

## 2022-03-06 DIAGNOSIS — E669 Obesity, unspecified: Secondary | ICD-10-CM | POA: Diagnosis not present

## 2022-03-06 DIAGNOSIS — Z13220 Encounter for screening for lipoid disorders: Secondary | ICD-10-CM

## 2022-03-06 DIAGNOSIS — Z136 Encounter for screening for cardiovascular disorders: Secondary | ICD-10-CM

## 2022-03-06 DIAGNOSIS — Z114 Encounter for screening for human immunodeficiency virus [HIV]: Secondary | ICD-10-CM

## 2022-03-06 DIAGNOSIS — Z131 Encounter for screening for diabetes mellitus: Secondary | ICD-10-CM

## 2022-03-06 DIAGNOSIS — F1010 Alcohol abuse, uncomplicated: Secondary | ICD-10-CM | POA: Insufficient documentation

## 2022-03-06 DIAGNOSIS — Z1159 Encounter for screening for other viral diseases: Secondary | ICD-10-CM

## 2022-03-06 DIAGNOSIS — F17201 Nicotine dependence, unspecified, in remission: Secondary | ICD-10-CM

## 2022-03-06 DIAGNOSIS — I10 Essential (primary) hypertension: Secondary | ICD-10-CM | POA: Insufficient documentation

## 2022-03-06 MED ORDER — LOSARTAN POTASSIUM-HCTZ 50-12.5 MG PO TABS: 1.0000 | ORAL_TABLET | Freq: Every day | ORAL | 2 refills | Status: AC

## 2022-03-06 NOTE — Assessment & Plan Note (Signed)
Witnessed apnea Morning headaches Elevated BP Reports +family hx +hx for tobacco +ETOH weekly  Due for PSG for mask fitting

## 2022-03-06 NOTE — Assessment & Plan Note (Signed)
Acute on chronic, previous negative cath workup however, unable to complete physical exertion of stress test d/t chest pain No longer followed by cardiology- previously seen by [facility] Denies use of drugs

## 2022-03-06 NOTE — Assessment & Plan Note (Signed)
Reports hx of smoking some days; when asked quantity and frequency Pt noted 1 cig/day for only 1 year. Never bought his own; but would smoke socially. Stopped 1 week ago.

## 2022-03-06 NOTE — Assessment & Plan Note (Signed)
Chronic, Body mass index is 30.96 kg/m. Discussed weight and purposeful exercise to assist with HTN reduction

## 2022-03-06 NOTE — Assessment & Plan Note (Signed)
Chronic, reports 8 drinks/week, binge like behavior on weekends  Continue to monitor

## 2022-03-06 NOTE — Assessment & Plan Note (Addendum)
New diagnosis; recommend Hyzaar given elevation in both SBP and DBP Return to office in 1 month Bring home log with home machine Pt initially with poor understanding that this is a lifelong diagnosis unless dramatic changes are made to lifestyle including diet and exercise; strong familiar component as well

## 2022-03-06 NOTE — Progress Notes (Signed)
I,Connie R Striblin,acting as a Education administrator for Gwyneth Sprout, FNP.,have documented all relevant documentation on the behalf of Gwyneth Sprout, FNP,as directed by  Gwyneth Sprout, FNP while in the presence of Gwyneth Sprout, FNP.  New patient visit  Patient: Johnathan Burnett   DOB: April 21, 1985   37 y.o. Male  MRN: [id number] Visit Date: 03/06/2022  Today's healthcare provider: Gwyneth Sprout, FNP  Patient presents for new patient visit to establish care.  Introduced to Designer, jewellery role and practice setting.  All questions answered.  Discussed provider/patient relationship and expectations.  Chief Complaint  Patient presents with   Establish Care   Subjective    Jerri Hargadon is a 37 y.o. male who presents today as a new patient to establish care.  HPI  Pt declined Covid-19 and influenza vaccine   Hypertension: Patient is here for evaluation of elevated blood pressures.  Age at onset of elevated blood pressure:  30s.Cardiac symptoms none. Patient denies chest pain, dyspnea, and lower extremity edema.  Cardiovascular risk factors: none. Use of agents associated with hypertension: none. History of target organ damage: none.  149/90 - yesterdays Bp  Past Medical History:  Diagnosis Date   Hypertension    Past Surgical History:  Procedure Laterality Date   HAIR TRANSPLANT     LEFT HEART CATH AND CORONARY ANGIOGRAPHY N/A 04/30/2017   Procedure: LEFT HEART CATH AND CORONARY ANGIOGRAPHY;  Surgeon: Yolonda Kida, MD;  Location: [place];  Service: Cardiovascular;  Laterality: N/A;   Family Status  Relation Name Status   PGM  (Not Specified)   Mother  Alive   Father  Alive   Family History  Problem Relation Age of Onset   Diabetes Paternal Grandmother    Social History   Socioeconomic History   Marital status: Single    Spouse name: Not on file   Number of children: Not on file   Years of education: Not on file   Highest education level: Not on file   Occupational History   Not on file  Tobacco Use   Smoking status: Former    Types: Cigarettes   Smokeless tobacco: Never   Tobacco comments:    1 cigarette/day; stopped 02/2022  Vaping Use   Vaping Use: Never used  Substance and Sexual Activity   Alcohol use: Yes    Alcohol/week: 8.0 standard drinks of alcohol    Types: 8 Cans of beer per week   Drug use: Never   Sexual activity: Yes    Partners: Female    Birth control/protection: None  Other Topics Concern   Not on file  Social History Narrative   Not on file   Social Determinants of Health   Financial Resource Strain: Not on file  Food Insecurity: Not on file  Transportation Needs: Not on file  Physical Activity: Not on file  Stress: Not on file  Social Connections: Not on file   No outpatient medications prior to visit.   No facility-administered medications prior to visit.   No Known Allergies   There is no immunization history on file for this patient.  Health Maintenance  Topic Date Due   Hepatitis C Screening  Never done   DTaP/Tdap/Td (1 - Tdap) Never done   COVID-19 Vaccine (2 - 2023-24 season) 10/05/2021   INFLUENZA VACCINE  05/05/2022 (Originally 09/04/2021)   HIV Screening  Completed   HPV VACCINES  Aged Out    No care team member  to display  Review of Systems   Objective    BP (!) 140/90   Pulse 78   Temp 97.6 F (36.4 C) (Oral)   Ht 5\' 3"  (1.6 m)   Wt 174 lb 12.8 oz (79.3 kg)   SpO2 98%   BMI 30.96 kg/m   Physical Exam Vitals and nursing note reviewed.  Constitutional:      General: He is not in acute distress.    Appearance: Normal appearance. He is obese. He is not ill-appearing, toxic-appearing or diaphoretic.  HENT:     Head: Normocephalic and atraumatic.  Eyes:     Pupils: Pupils are equal, round, and reactive to light.  Cardiovascular:     Rate and Rhythm: Normal rate and regular rhythm.     Pulses: Normal pulses.     Heart sounds: Normal heart sounds.  Pulmonary:      Effort: Pulmonary effort is normal.     Breath sounds: Normal breath sounds.  Musculoskeletal:        General: Normal range of motion.     Cervical back: Normal range of motion.     Right lower leg: No edema.     Left lower leg: No edema.  Skin:    General: Skin is warm and dry.     Capillary Refill: Capillary refill takes less than 2 seconds.  Neurological:     General: No focal deficit present.     Mental Status: He is alert and oriented to person, place, and time. Mental status is at baseline.  Psychiatric:        Mood and Affect: Mood is anxious.        Behavior: Behavior normal.        Thought Content: Thought content normal.        Judgment: Judgment normal.    Depression Screen     No data to display         No results found for any visits on 03/06/22.  Assessment & Plan      Problem List Items Addressed This Visit       Cardiovascular and Mediastinum   Primary hypertension - Primary    New diagnosis; recommend Hyzaar given elevation in both SBP and DBP Return to office in 1 month Bring home log with home machine Pt initially with poor understanding that this is a lifelong diagnosis unless dramatic changes are made to lifestyle including diet and exercise; strong familiar component as well       Relevant Medications   losartan-hydrochlorothiazide (HYZAAR) 50-12.5 MG tablet   Other Relevant Orders   Comprehensive Metabolic Panel (CMET)   CBC with Differential/Platelet   TSH + free T4     Respiratory   OSA (obstructive sleep apnea)    Witnessed apnea Morning headaches Elevated BP Reports +family hx +hx for tobacco +ETOH weekly  Due for PSG for mask fitting       Relevant Orders   Comprehensive Metabolic Panel (CMET)   PSG Sleep Study     Other   Alcohol use disorder, mild, abuse    Chronic, reports 8 drinks/week, binge like behavior on weekends  Continue to monitor      Atypical chest pain    Acute on chronic, previous negative cath workup  however, unable to complete physical exertion of stress test d/t chest pain No longer followed by cardiology- previously seen by [facility] Denies use of drugs      Encounter for hepatitis C screening test for low  risk patient   Relevant Orders   Hepatitis C Antibody   Encounter for lipid screening for cardiovascular disease   Relevant Orders   Lipid panel   Encounter for screening for HIV   Relevant Orders   HIV antibody (with reflex)   Obesity (BMI 30-39.9)    Chronic, Body mass index is 30.96 kg/m. Discussed weight and purposeful exercise to assist with HTN reduction       Relevant Orders   Comprehensive Metabolic Panel (CMET)   Hemoglobin A1c   CBC with Differential/Platelet   TSH + free T4   Lipid panel   Screening for diabetes mellitus   Relevant Orders   Hemoglobin A1c   Tobacco dependence in remission    Reports hx of smoking some days; when asked quantity and frequency Pt noted 1 cig/day for only 1 year. Never bought his own; but would smoke socially. Stopped 1 week ago.      Return in about 4 weeks (around 04/03/2022) for HTN management.    Vonna Kotyk, FNP, have reviewed all documentation for this visit. The documentation on 03/06/22 for the exam, diagnosis, procedures, and orders are all accurate and complete.  Gwyneth Sprout, [place] [phone] (phone) [phone] (fax)  [place]

## 2022-03-07 ENCOUNTER — Other Ambulatory Visit: Payer: Self-pay | Admitting: Family Medicine

## 2022-03-07 DIAGNOSIS — R7989 Other specified abnormal findings of blood chemistry: Secondary | ICD-10-CM

## 2022-03-07 LAB — COMPREHENSIVE METABOLIC PANEL
ALT: 168 IU/L — ABNORMAL HIGH (ref 0–44)
AST: 54 IU/L — ABNORMAL HIGH (ref 0–40)
Albumin/Globulin Ratio: 1.6 (ref 1.2–2.2)
Albumin: 4.9 g/dL (ref 4.1–5.1)
Alkaline Phosphatase: 79 IU/L (ref 44–121)
BUN/Creatinine Ratio: 16 (ref 9–20)
BUN: 16 mg/dL (ref 6–20)
Bilirubin Total: 0.6 mg/dL (ref 0.0–1.2)
CO2: 23 mmol/L (ref 20–29)
Calcium: 9.6 mg/dL (ref 8.7–10.2)
Chloride: 101 mmol/L (ref 96–106)
Creatinine, Ser: 0.99 mg/dL (ref 0.76–1.27)
Globulin, Total: 3.1 g/dL (ref 1.5–4.5)
Glucose: 84 mg/dL (ref 70–99)
Potassium: 4.4 mmol/L (ref 3.5–5.2)
Sodium: 137 mmol/L (ref 134–144)
Total Protein: 8 g/dL (ref 6.0–8.5)
eGFR: 102 mL/min/{1.73_m2} (ref >59)

## 2022-03-07 LAB — CBC WITH DIFFERENTIAL/PLATELET
Basophils Absolute: 0 10*3/uL (ref 0.0–0.2)
Basos: 1 %
EOS (ABSOLUTE): 0.1 10*3/uL (ref 0.0–0.4)
Eos: 2 %
Hematocrit: 51.9 % — ABNORMAL HIGH (ref 37.5–51.0)
Hemoglobin: 17.9 g/dL — ABNORMAL HIGH (ref 13.0–17.7)
Immature Grans (Abs): 0 10*3/uL (ref 0.0–0.1)
Immature Granulocytes: 0 %
Lymphocytes Absolute: 2.1 10*3/uL (ref 0.7–3.1)
Lymphs: 36 %
MCH: 31.5 pg (ref 26.6–33.0)
MCHC: 34.5 g/dL (ref 31.5–35.7)
MCV: 91 fL (ref 79–97)
Monocytes Absolute: 0.6 10*3/uL (ref 0.1–0.9)
Monocytes: 10 %
Neutrophils Absolute: 3.1 10*3/uL (ref 1.4–7.0)
Neutrophils: 51 %
Platelets: 212 10*3/uL (ref 150–450)
RBC: 5.69 x10E6/uL (ref 4.14–5.80)
RDW: 12.4 % (ref 11.6–15.4)
WBC: 6 10*3/uL (ref 3.4–10.8)

## 2022-03-07 LAB — LIPID PANEL
Chol/HDL Ratio: 5.9 ratio — ABNORMAL HIGH (ref 0.0–5.0)
Cholesterol, Total: 201 mg/dL — ABNORMAL HIGH (ref 100–199)
HDL: 34 mg/dL — ABNORMAL LOW (ref >39)
LDL Chol Calc (NIH): 127 mg/dL — ABNORMAL HIGH (ref 0–99)
Triglycerides: 223 mg/dL — ABNORMAL HIGH (ref 0–149)
VLDL Cholesterol Cal: 40 mg/dL (ref 5–40)

## 2022-03-07 LAB — HEMOGLOBIN A1C
Est. average glucose Bld gHb Est-mCnc: 105 mg/dL
Hgb A1c MFr Bld: 5.3 % (ref 4.8–5.6)

## 2022-03-07 LAB — TSH+FREE T4
Free T4: 1.18 ng/dL (ref 0.82–1.77)
TSH: 1.68 u[IU]/mL (ref 0.450–4.500)

## 2022-03-07 LAB — HIV ANTIBODY (ROUTINE TESTING W REFLEX): HIV Screen 4th Generation wRfx: NONREACTIVE

## 2022-03-07 LAB — HEPATITIS C ANTIBODY: Hep C Virus Ab: NONREACTIVE

## 2022-03-07 NOTE — Progress Notes (Signed)
Significant elevations in liver enzymes; both AST and ALT.  -->Recommend reduction in alcohol and NSAIDs if applicable.  -->Recommend CMP in 1 month.  Both hemoglobin and hematocrit are slightly elevated as well; could be underlying untreated lung or breathing concerns, like OSA as discussed.  -->Continue with recommendation for sleep study.  -->Repeat CBC in 1 month.  Cholesterol is markedly elevated- total, fats, bad/LDL cholesterol are all high and the heart protective good/HDL cholesterol is low.  -->Elevated risk of heart attack and/or stroke.  -->I continue to recommend diet low in saturated fat and regular exercise - 30 min at least 5 times per week Normal thyroid and infectious disease workup.

## 2022-03-20 ENCOUNTER — Encounter (INDEPENDENT_AMBULATORY_CARE_PROVIDER_SITE_OTHER): Payer: BLUE CROSS/BLUE SHIELD | Admitting: Internal Medicine

## 2022-03-20 DIAGNOSIS — G4719 Other hypersomnia: Secondary | ICD-10-CM | POA: Diagnosis not present

## 2022-04-02 ENCOUNTER — Encounter (INDEPENDENT_AMBULATORY_CARE_PROVIDER_SITE_OTHER): Payer: BLUE CROSS/BLUE SHIELD | Admitting: Internal Medicine

## 2022-04-02 DIAGNOSIS — G4733 Obstructive sleep apnea (adult) (pediatric): Secondary | ICD-10-CM | POA: Diagnosis not present

## 2022-04-02 NOTE — Procedures (Signed)
[place] Report Part I                                                               Phone: [phone] Fax: [phone]  Patient Name: Johnathan Burnett, Johnathan Burnett. Acquisition Number: [id number]  Date of Birth: 03-11-1985 Acquisition Date: 03/20/2022  Referring Physician: Daneil Dan T. Rollene Rotunda, FNP     History: The patient is a 37 year old  who was referred for evaluation of . Medical History: Hypertension, symptoms of OSA  Medications: losartan-hydrochlorothiazide.  Procedure: This routine overnight polysomnogram was performed on the Alice 5 using the standard diagnostic protocol. This included 6 channels of EEG, 2 channels of EOG, chin EMG, bilateral anterior tibialis EMG, nasal/oral thermistor, PTAF (nasal pressure transducer), chest and abdominal wall movements, EKG, and pulse oximetry.  Description: The total recording time was 378.0 minutes. The total sleep time was 344.0 minutes. There were a total of 11.0 minutes of wakefulness after sleep onset for a slightly reducedsleep efficiency of 91.0%. The latency to sleep onset was within normal limitsat 23.0 minutes. The R sleep onset latency wasprolonged at 161.0 minutes. Sleep parameters, as a percentage of the total sleep time, demonstrated 2.9% of sleep was in N1 sleep, 84.7% N2, 0.0% N3 and 12.4% R sleep. There were a total of 64 arousals for an arousal index of 11.2 arousals per hour of sleep that was within normal limits.  Respiratory monitoring demonstrated   snoring. All sleep was in the supine position. There were 486 apneas and hypopneas for an Apnea Hypopnea Index of 84.8 apneas and hypopneas per hour of sleep. The REM related apnea hypopnea index was 86.1/hr of REM sleep compared to a NREM AHI of 84.2/hr.  The average duration of the respiratory events was 26.7 seconds with a maximum duration of 51.0 seconds. The respiratory events were associated with peripheral oxygen desaturations on the average to 81%. The lowest  oxygen desaturation associated with a respiratory event was 70%. Additionally, the baseline oxygen saturation during wakefulness was 95%, during NREM sleep averaged 90%, and during REM sleep averaged  89%. The total duration of oxygen < 90% was 147.9 minutes and <80% was 11.8 minutes.  Cardiac monitoring-  demonstrate transient cardiac decelerations associated with the apneas.  significant cardiac rhythm irregularities.   Periodic limb movement monitoring- did not demonstrate periodic limb movements.      Impression: This routine overnight polysomnogram demonstrated very severe obstructive sleep apnea with an overall Apnea Hypopnea Index of 84.8 apneas and hypopneas per hour of sleep with the lowest desaturation to 70%. All sleep was in the supine position.   slightly reduced sleep efficiency, a reduced REM percentage and no slow wave sleep. These findings would appear to be due to the obstructive sleep apnea.  Recommendations:    An emergent CPAP titration would be recommended due to the severity of the sleep apnea. Some supine sleep should be ensured to optimize the titration. Additionally, would recommend weight loss in a patient with a BMI of 47.8.    Allyne Gee, MD, [facility] Diplomate ABMS-Pulmonary, Critical Care and Sleep Medicine  Electronically reviewed and digitally signed  [place] Report Part II  Phone: [phone] Fax: [phone]  Patient last name Burnett Neck Size 15.0  in. Acquisition [id number]  Patient first name Johnathan Burnett. Weight 270.0 lbs. Started 03/20/2022 at 10:58:28 PM  Birth date 1985/11/02 Height 63.0 in. Stopped 03/21/2022 at 5:28:22 AM  Age 89 BMI 47.8 lb/in2 Duration 378.0  Study Type Adult      [place], Johnathan Endo  Reviewed by: Richelle Ito. Henke, PhD, ABSM, FAASM Sleep Data: Lights Out: 11:07:28 PM Sleep Onset: 11:30:28 PM  Lights On: 5:25:28 AM Sleep Efficiency: 91.0 %  Total Recording Time: 378.0 min  Sleep Latency (from Lights Off) 23.0 min  Total Sleep Time (TST): 344.0 min R Latency (from Sleep Onset): 161.0 min  Sleep Period Time: 354.5 min Total number of awakenings: 10  Wake during sleep: 10.5 min Wake After Sleep Onset (WASO): 11.0 min   Sleep Data:         Arousal Summary: Stage  Latency from lights out (min) Latency from sleep onset (min) Duration (min) % Total Sleep Time  Normal values  N 1 23.0 0.0 10.0 2.9 (5%)  N 2 24.0 1.0 291.5 84.7 (50%)  N 3       0.0 0.0 (20%)  R 184.0 161.0 42.5 12.4 (25%)   Number Index  Spontaneous 17 3.0  Apneas & Hypopneas 47 8.2  RERAs 0 0.0       (Apneas & Hypopneas & RERAs)  (47) (8.2)  Limb Movement 0 0.0  Snore 0 0.0  TOTAL 64 11.2     Respiratory Data:  CA OA MA Apnea Hypopnea* A+ H RERA Total  Number 16 403 10 429 57 486 0 486  Mean Dur (sec) 26.3 26.4 31.6 26.5 27.8 26.7 0.0 26.7  Max Dur (sec) 32.5 51.0 47.5 51.0 43.0 51.0 0.0 51.0  Total Dur (min) 7.0 177.3 5.3 189.6 26.4 216.0 0.0 216.0  % of TST 2.0 51.5 1.5 55.1 7.7 62.8 0.0 62.8  Index (#/h TST) 2.8 70.3 1.7 74.8 9.9 84.8 0.0 84.8  *Hypopneas scored based on 4% or greater desaturation.  Sleep Stage:        REM NREM TST  AHI 86.1 84.2 84.8  RDI 86.1 84.2 84.8           Body Position Data:  Sleep (min) TST (%) REM (min) NREM (min) CA (#) OA (#) MA (#) HYP (#) AHI (#/h) RERA (#) RDI (#/h) Desat (#)  Supine 344.0 100.00 42.5 301.5 16 403 10 57 84.8 0 84.8 563  Non-Supine 0.00 0.00 0.00 0.00 0.00 0.00 0.00 0.00 0.00 0 0.00 0.00  Left: 0.0 0.00 0.0 0.0 0 0 0 0 0.0 0 0.00 0    Snoring: Total number of snoring episodes  0  Total time with snoring    min (   % of sleep)   Oximetry Distribution:             WK REM NREM TOTAL  Average (%)   95 89 90 91  < 90% 1.4 20.4 126.1 147.9  < 80% 0.0 4.0 7.8 11.8  < 70% 0.0 0.0 0.0 0.0  # of Desaturations* 6 68 490 564  Desat Index (#/hour) 10.6 96.0 97.5 98.4  Desat Max (%) '16 29 24 29  '$ Desat Max Dur  (sec) 80.0 50.0 51.0 80.0  Approx Min O2 during sleep 70  Approx min O2 during a respiratory event 70  Was Oxygen added (Y/N) and final rate :    LPM  *Desaturations based on 3% or greater drop from baseline.   Cheyne Stokes Breathing: None  Present    Heart Rate Summary:  Average Heart Rate During Sleep 67.2 bpm      Highest Heart Rate During Sleep (95th %) 79.0 bpm      Highest Heart Rate During Sleep 180 bpm (artifact)  Highest Heart Rate During Recording (TIB) 187 bpm (artifact)   Heart Rate Observations: Event Type # Events   Bradycardia 0 Lowest HR Scored: N/A  Sinus Tachycardia During Sleep 0 Highest HR Scored: N/A  Narrow Complex Tachycardia 0 Highest HR Scored: N/A  Wide Complex Tachycardia 0 Highest HR Scored: N/A  Asystole 0 Longest Pause: N/A  Atrial Fibrillation 0 Duration Longest Event: N/A  Other Arrythmias   Type:    Periodic Limb Movement Data: (Primary legs unless otherwise noted) Total # Limb Movement 0 Limb Movement Index 0.0  Total # PLMS    PLMS Index     Total # PLMS Arousals    PLMS Arousal Index     Percentage Sleep Time with PLMS   min (   % sleep)  Mean Duration limb movements (secs)

## 2022-04-19 NOTE — Procedures (Signed)
[place] Report Part I  Phone: [phone] Fax: [phone]  Patient Name: Johnathan Burnett, Johnathan Burnett. Acquisition Number: [id number]  Date of Birth: 1985-04-19 Acquisition Date: 04/02/2022  Referring Physician: Daneil Dan T. Rollene Rotunda, FNP     History: The patient is a 37 year old  . Medical History: hypertension and symptoms of OSA.  Medications: losartan-hydrochlorothiazide  Procedure: This routine overnight polysomnogram was performed on the Alice 5 using the standard CPAP protocol. This included 6 channels of EEG, 2 channels of EOG, chin EMG, bilateral anterior tibialis EMG, nasal/oral thermistor, PTAF (nasal pressure transducer), chest and abdominal wall movements, EKG, and pulse oximetry.  Description: The total recording time was 363.1 minutes. The total sleep time was 339.0 minutes. There were a total of 23.9 minutes of wakefulness after sleep onset for a goodsleep efficiency of 93.4%. The latency to sleep onset was shortat 0.2 minutes. The R sleep onset latency was short at 57.5 minutes. Sleep parameters, as a percentage of the total sleep time, demonstrated 3.7% of sleep was in N1 sleep, 33.6% N2, 22.7% N3 and 40.0% R sleep. There were a total of 136 arousals for an arousal index of 24.1 arousals per hour of sleep that was elevated.  Overall, there were a total of 41 respiratory events for a respiratory disturbance index, which includes apneas, hypopneas and RERAs (increased respiratory effort) of 7.3 respiratory events per hour of sleep during the pressure titration.  was initiated at 4 cm H2O at lights out, 11:37 p.m. It was titrated in 1-2 cm increments, largely for REM-related events to the final pressure of 15 cm H2O. The apnea was controlled at 6 cm H2O in non-REM without mask leak and at 14 cm H2O in REM. All sleep was in the supine position.  Additionally, the baseline oxygen saturation during wakefulness was 97%, during NREM sleep averaged 97%, and during REM  sleep averaged 98%. The total duration of oxygen < 90% was 0.0 minutes.  Cardiac monitoring-  significant cardiac rhythm irregularities.   Periodic limb movement monitoring- demonstrated that there were 141 periodic limb movements for a periodic limb movement index of 25.0 periodic limb movements per hour of sleep.   Impression: This patient's obstructive sleep apnea demonstrated significant improvement with the utilization of nasal  at pressures from 6-14 cm H2O.   There was a significantly elevated periodic limb movement index of 25.0 periodic limb movements per hour of sleep. Sometimes these limb movements appear transiently with the initiation of CPAP. Clinical correlation is suggested.   Recommendations: Would recommend utilization of auto-adjusting  at 6-14 cm H2O.   Set up should be as soon as possible due to the severity of the sleep apnea.   A medium ResMed Airfit F20 mask was used. Chin strap used during study- no. Humidifier used during study- yes.     Allyne Gee, MD, [facility] Diplomate ABMS-Pulmonary, Critical Care and Sleep Medicine  Electronically reviewed and digitally signed   [place] CPAP/BIPAP Polysomnogram Report Part II Phone: [phone] Fax: [phone]  Patient last name Ibarra-Rovira Neck Size 15.0 in. Acquisition [phone]  Patient first name Johnathan Burnett. Weight 172.0 lbs. Started 04/02/2022 at 11:29:20 PM  Birth date 01-10-1986 Height 63.0 in. Stopped 04/03/2022 at 6:29:56 AM  Age 38      Type Adult BMI 30.5 lb/in2 Duration 363.1  Report generated by Juleen Starr, RPSGT  Reviewed by: Richelle Ito. Saunders Glance, PhD, ABSM, FAASM Sleep Data: Lights Out: 11:37:08 PM Sleep Onset: 11:37:20 PM  Lights On: 5:40:14 AM Sleep Efficiency: 93.4 %  Total Recording Time: 363.1 min Sleep Latency (from Lights Off) 0.2 min  Total Sleep Time (TST): 339.0 min R Latency (from Sleep Onset): 57.5 min  Sleep Period Time: 351.5 min Total number of awakenings: 9  Wake during  sleep: 12.5 min Wake After Sleep Onset (WASO): 23.9 min   Sleep Data:         Arousal Summary: Stage  Latency from lights out (min) Latency from sleep onset (min) Duration (min) % Total Sleep Time  Normal values  N 1 0.2 0.0 12.5 3.7 (5%)  N 2 2.2 2.0 114.0 33.6 (50%)  N 3 25.2 25.0 77.0 22.7 (20%)  R 57.7 57.5 135.5 40.0 (25%)   Number Index  Spontaneous 61 10.8  Apneas & Hypopneas 31 5.5  RERAs 5 0.9       (Apneas & Hypopneas & RERAs)  (36) (6.4)  Limb Movement 44 7.8  Snore 0 0.0  TOTAL 141 25.0     Respiratory Data:  CA OA MA Apnea Hypopnea* A+ H RERA Total  Number 0 9 2 11 25  36 5 41  Mean Dur (sec) 0.0 17.3 19.8 17.8 29.8 26.2 29.6 26.6  Max Dur (sec) 0.0 25.5 20.5 25.5 60.0 60.0 41.5 60.0  Total Dur (min) 0.0 2.6 0.7 3.3 12.4 15.7 2.5 18.2  % of TST 0.0 0.8 0.2 1.0 3.7 4.6 0.7 5.4  Index (#/h TST) 0.0 1.6 0.4 1.9 4.4 6.4 0.9 7.3  *Hypopneas scored based on 4% or greater desaturation.  Sleep Stage:         REM NREM TST  AHI 7.5 5.6 6.4  RDI 9.3 5.9 7.3   Sleep (min) TST (%) REM (min) NREM (min) CA (#) OA (#) MA (#) HYP (#) AHI (#/h) RERA (#) RDI (#/h) Desat (#)  Supine 339.0 100.00 135.5 203.5 0 9 2 25  6.4 5 7.3 41  Non-Supine 0.00 0.00 0.00 0.00 0.00 0.00 0.00 0.00 0.00 0 0.00 0.00     Snoring: Total number of snoring episodes  0  Total time with snoring    min (   % of sleep)   Oximetry Distribution:             WK REM NREM TOTAL  Average (%)   97 98 97 97  < 90% 0.0 0.0 0.0 0.0  < 80% 0.0 0.0 0.0 0.0  < 70% 0.0 0.0 0.0 0.0  # of Desaturations* 1 11 20  32  Desat Index (#/hour) 2.5 4.9 5.9 5.7  Desat Max (%) 3 10 8 10   Desat Max Dur (sec) 27.0 56.0 56.0 56.0  Approx Min O2 during sleep 89  Approx min O2 during a respiratory event 89  Was Oxygen added (Y/N) and final rate :    LPM  *Desaturations based on 3% or greater drop from baseline.   Cheyne Stokes Breathing: None Present    Heart Rate Summary:  Average Heart Rate During Sleep  57.3 bpm      Highest Heart Rate During Sleep (95th %) 68.0 bpm      Highest Heart Rate During Sleep 197 bpm (artifact)  Highest Heart Rate During Recording (TIB) 197 bpm (artifact)   Heart Rate Observations: Event Type # Events   Bradycardia 0 Lowest HR Scored: N/A  Sinus Tachycardia During Sleep 0 Highest HR Scored: N/A  Narrow Complex Tachycardia 0 Highest HR Scored: N/A  Wide Complex Tachycardia 0 Highest HR Scored: N/A  Asystole 0  Longest Pause: N/A  Atrial Fibrillation 0 Duration Longest Event: N/A  Other Arrythmias   Type:   Periodic Limb Movement Data: (Primary legs unless otherwise noted) Total # Limb Movement 175 Limb Movement Index 31.0  Total # PLMS 141 PLMS Index 25.0  Total # PLMS Arousals 24 PLMS Arousal Index 4.2  Percentage Sleep Time with PLMS 73.57min (21.6 % sleep)  Mean Duration limb movements (secs) 731.0    IPAP Level (cmH2O) EPAP Level (cmH2O) Total Duration (min) Sleep Duration (min) Sleep (%) REM (%) CA  #) OA # MA # HYP #) AHI (#/hr) RERAs # RERAs (#/hr) RDI (#/hr)  4 4 12.6 12.6 100.0 0.0 0 0 0 10 47.6 0 0.0 47.6  6 6  85.9 85.4 99.4 47.6 0 1 0 5 4.2 0 0.0 4.2  8 8  19.0 19.0 100.0 93.2 0 1 0 2 9.5 0 0.0 9.5  9 9  7.0 7.0 100.0 0.0 0 1 0 0 8.6 0 0.0 8.6  10 10  117.2 106.7 91.0 25.6 0 5 2 4  6.2 0 0.0 6.2  12 12  50.9 49.9 98.0 15.5 0 1 0 4 6.0 4 4.8 10.8  14 14  32.2 31.7 98.4 92.5 0 0 0 0 0.0 0 0.0 0.0  15 15 24.3 24.3 100.0 35.0 0 0 0 0 0.0 0 0.0 0.0

## 2022-08-19 ENCOUNTER — Ambulatory Visit: Payer: BLUE CROSS/BLUE SHIELD | Admitting: Internal Medicine

## 2022-08-19 NOTE — Progress Notes (Signed)
Pt did not show for scheduled appointment.
